# Patient Record
Sex: Female | Born: 1975 | Race: Black or African American | Hispanic: No | Marital: Married | Smoking: Never smoker
Health system: Southern US, Community
[De-identification: ages and names within clinical notes are randomized; demographics above are authoritative.]

## PROBLEM LIST (undated history)

## (undated) DIAGNOSIS — I10 Essential (primary) hypertension: Secondary | ICD-10-CM

---

## 2016-02-16 ENCOUNTER — Encounter (HOSPITAL_COMMUNITY): Payer: Self-pay | Admitting: Unknown Physician Specialty

## 2016-02-23 ENCOUNTER — Ambulatory Visit (HOSPITAL_COMMUNITY)
Admission: RE | Admit: 2016-02-23 | Discharge: 2016-02-23 | Disposition: A | Discharge status: Home / Self Care | Payer: Medicaid - Out of State | Source: Ambulatory Visit | Attending: Unknown Physician Specialty | Admitting: Unknown Physician Specialty

## 2016-02-23 ENCOUNTER — Encounter (HOSPITAL_COMMUNITY): Payer: Self-pay

## 2016-02-23 VITALS — BP 146/99 | HR 121

## 2016-02-23 DIAGNOSIS — O10012 Pre-existing essential hypertension complicating pregnancy, second trimester: Secondary | ICD-10-CM | POA: Diagnosis not present

## 2016-02-23 DIAGNOSIS — O09522 Supervision of elderly multigravida, second trimester: Secondary | ICD-10-CM | POA: Insufficient documentation

## 2016-02-23 DIAGNOSIS — Z315 Encounter for genetic counseling: Secondary | ICD-10-CM | POA: Diagnosis present

## 2016-02-23 DIAGNOSIS — O09529 Supervision of elderly multigravida, unspecified trimester: Secondary | ICD-10-CM | POA: Insufficient documentation

## 2016-02-23 DIAGNOSIS — O43212 Placenta accreta, second trimester: Secondary | ICD-10-CM | POA: Diagnosis not present

## 2016-02-23 DIAGNOSIS — Z3A21 21 weeks gestation of pregnancy: Secondary | ICD-10-CM | POA: Insufficient documentation

## 2016-02-23 HISTORY — DX: Essential (primary) hypertension: I10

## 2016-02-23 NOTE — Progress Notes (Signed)
Genetic Counseling  Visit Summary Note  Appointment Date: 02/23/2016 Referred By: Santo Held  Date of Birth: 10-12-1975  Pregnancy history: [id number] Estimated Date of Delivery: 06/29/16 Estimated Gestational Age: 25w6dAttending: DRenella Cunas MD  Ms. JJoesphine Schemmwas seen for genetic counseling because of a maternal age of 89     In summary:  Discussed increased risk for fetal aneuploidy with advanced maternal age  Reviewed results of patient's NIPS and ultrasound  Discussed limitations of screening and option of diagnostic testing  Declined amniocentesis  Reviewed family history concerns  Discussed carrier screening options-declined  She was counseled regarding maternal age and the association with risk for chromosome conditions due to nondisjunction with aging of the ova.  We reviewed chromosomes, nondisjunction, and the associated 1 in 563risk for fetal aneuploidy related to a maternal age of 89at 224w6destation.  She was counseled that the risk for aneuploidy decreases as gestational age increases, accounting for those pregnancies which spontaneously abort.  We specifically discussed Down syndrome (trisomy 2169 trisomies 1345nd 1857and sex chromosome aneuploidies (47,XXX and 47,XXY) including the common features and prognoses of each.   We also reviewed the results of Tracey Haynes-invasive prenatal screening (NIPS) and ultrasound. We discussed that NIPS analyzes placental cell free DNA in maternal circulation to evaluate for the presence of extra chromosome conditions.  Thus, it is able to provide risk assessment for specific chromosome conditions, but is not diagnostic.  Specifically, Tracey Haynes Panorama with 22q11 deletion screening. Based on this test, the chance for her baby to have aneuploidy for chromosomes 13, 18 or 21 was reduced to less than 1 in 10000.  The sex chromosome analysis was also within normal limits and was consistent with female fetal sex. The  chance for fetal 22q11 deletion syndrome was reduced from 1 in 2,000 to ~1 in 3,000.   Ms. JoFebresreviously had a detailed anatomy ultrasound. She was counseled that 50-80% of fetuses with Down syndrome and up to 90% of fetuses with trisomies 1311nd 18, when well visualized, have detectable anomalies or soft markers by ultrasound.  We discussed that her ultrasound was limited due to fetal positioning, but no specific concerns were identified at this time.  The patient is scheduled to return for a follow-up ultrasound in 2 weeks.  We also discussed the availability of diagnostic testing via amniocentesis.  We reviewed the risks, benefits and limitations of amniocentesis including the approximate 1 in 500 risk for pregnancy complications following amniocentesis. We discussed the possible results that the tests might provide including: positive, negative, unanticipated, and no result. Finally, she counseled regarding the cost of each option and potential out of pocket expenses. After reviewing the above results and the available options, Tracey Haynes that she is not interested in pursuing diagnostic testing at this time or in the future, given the associated risk of complications.  She understands that ultrasound and NIPS cannot rule out all birth defects or genetic syndromes.    Tracey Haynes provided with written information regarding cystic fibrosis (CF), spinal muscular atrophy (SMA) and hemoglobinopathies including the carrier frequency, availability of carrier screening and prenatal diagnosis if indicated.  In addition, we discussed that CF and hemoglobinopathies are routinely screened for as part of the [place] newborn screening panel.  After further discussion, she declined screening for CF, SMA and hemoglobinopathies.  Both family histories were reviewed and found to be contributory for Ms. JoHazelettaving a daughter, with a different partner, who  has autism. This child was born at [redacted] weeks  gestation due to severe preeclampsia. We discussed that autism is part of the spectrum of conditions referred to as Autistic spectrum disorders (ASD).  We discussed that ASDs are among the most common neurodevelopmental disorders, with approximately 1 in 110 children meeting criteria for ASD.  Approximately 80% of individuals diagnosed are female.  There is strong evidence that genetic factors play a critical role in development of ASD.  There have been recent advances in identifying specific genetic causes of ASD, however, there are still many individuals for whom the etiology of the ASD is not known.  Once a family has a child with a diagnosis of ASD, there is a 13.5% chance to have another child with ASD.  If the pregnancy is female the chance is approximately 26%.  Given that the current pregnancy has a different father, the risk of recurrence is estimated to be 13%. She understands that at this time there is not genetic testing available for ASD for most families. The remainder of the family histories were noncontributory for birth defects, intellectual disability, and known genetic conditions. Without further information regarding the provided family history, an accurate genetic risk cannot be calculated. Further genetic counseling is warranted if more information is obtained.  Tracey Haynes denied exposure to environmental toxins or chemical agents. She denied the use of alcohol, tobacco or street drugs. She denied significant viral illnesses during the course of her pregnancy. Her medical and surgical histories were contributory for [facility]. Tracey Haynes met with Dr. Margurite Auerbach for MFM consult today. Please see his note for specific details regarding recommendations and management.   I counseled Tracey Haynes regarding the above risks and available options.  The approximate face-to-face time with the genetic counselor was 37 minutes.  Filbert Schilder, MS  Certified Genetic Counselor

## 2016-02-23 NOTE — Progress Notes (Signed)
MFM consult, Staff Note: ? I spoke to your patient regarding her pregnancy complicated by placenta previa in context of 3 prior cesarean sections. [facility] which are consistent with PLACENTA ACCRETA. Bleeding complicates approximately 3% of pregnancies in the third trimester. Bleeding can be a cause of major perinatal and maternal morbidity and mortality and is most frequently due to placental separation. Other causes include placenta previa and placental accreta. ? Placenta accreta is associated with abnormal adherence of the placenta to the uterine wall. The incidence has increased and seems to parallel the increasing cesarean section rate. The incidence is about 1 in 500 pregnancies. The strongest association of placenta accreta is placenta previa and uterine surgery. In patients with placenta previa, the risk of accreta is 10-25% with one prior cesarean section. The risk of accreta is 3%, 11%, 40%, 61% and 67% with the first, second, third, fourth and fifth cesarean section respectively.  Given three prior cesarean sections, anterior previa, and images showing lack of thin retroplacental sonolucent border that is notably irregular, she has >50% chance of a morbidly adherent placenta. ? On review, there was no clear evidence of increta, percreta or invasion of the placenta into the bladder or surrounding organs/structures. She clearly understands this is a serious condition that WILL LIKELY REQUIRE CESAREAN HYSTERECTOMY (unless the placenta just spontaneously delivers during repeat cesarean). I recommend complete transfer of care, discontinuation of the low dose aspirin (risk of inhibiting platelet function in this patient greatly outweighs any minimal risk of IUGR/IUFD reduction by [facility]), and [facility]. ? Chronic hypertension (CHTN): During our discussion, I reviewed hypertension as a cause of uteroplacental insufficiency, with  increased risk of IUGR, oligohydramnios, and stillbirth. I told her that her hypertension also places her at increased risk for preeclampsia, describing the triad of increased blood pressure, proteinuria, and abnormal edema. Lastly, hypertension (severe range) increases the risk of placental abruption, especially in the setting of superimposed preeclampsia.  I reviewed the essential tenets in the most recent guidelines for management of hypertension in pregnancy in accordance with the American College of Obstetrics and Gynecology expert opinion. We talked about the medical treatment of hypertension in pregnancy. I outlined the different classes of medications, emphasizing that angiotensin enzyme inhibitors and angoitensin receptor blockers are contraindicated, and diuretics are relatively contraindicated. I told her that beta-blockers and calcium channel blockers are commonly used to treat hypertension in pregnancy, and that both are felt to be safe for use in pregnancy.  ? Her dose of labetalol and procardia are adequate in current maintainance of her blood pressure in pregnancy as evidenced by BP today.  I outlined the usual plan of management for hypertension in pregnancy. She should have her blood pressure carefully followed, and her medications adjusted to keep her BP in the target range of around 130-159/70-109 mm/Hg; ie, HTN should not be treated (no medication adjustment) until 160/110 or greater measurements for blood pressures to be consistent with current ACOG/SMFM guidelines (ie, guidelines are 160/110 in absence of renal/cardiac disease during pregnancy).   All of her questions were answered.  She prefers to keep regular prenatal visits with you locally until the 3rd trimester with monthly ultrasounds here in the interim.  I scheduled her accordingly.  If you agree with transfer of care, please go ahead and place the request now.  Summary of Recommendations:  A.  Accreta: 1. Transfer of  care 2. Discontinuation of low dose aspirin and bleeding  precautions (ie, patient reports minimal spotting on occasion but no menstrual like bleeding) 3. Pelvic rest 4. Multidisciplinary team approach  A. Anesthesia consultation (late trimester) B. Gynecologic oncology consultation C. NICU consultation (late trimester) 5. Continued counseling regarding cesarean hysterectomy (dialogue initiated today--she understands this will be her mode of delivery) 6. Fetal growth ultrasounds q4 weeks with cervical length assessment 7. If bleeding occurs in second and third trimester admit to the hospital for minimum 72 hr assessment 8. Pelvic MRI 28-30 weeks  9. consider admission around 32 weeks or so depending on bleeding, cervical length, etc. 10. In-hospital assessment of stable patient: NST once to twice daily. Weekly AFI, presentation scan. 11. Delivery should be individualized, however, maternal and neonatal outcome is optimized in stable patients with planned delivery at 34-35 weeks, after betamethasone.  B. HTN 1.discontinuation of HCTZ (risk for oligohydramnios) 2. Adjustment of labetalol and/or nifedipine to maintain BP <160/110  3. Antenatal testing at 32 weeks 4. Interval growth monthly by ultrasound as above  C. Prior preterm delivery 1. Weekly Makena 2. I recommend that transfer of care for prenatal visits be delayed until 28-30 weeks given the burden of transportation for the patient.  I feel that her prenatal visits, BP checks, and Makena injections can be maintained locally for her convenience (ie, she lives in [place], [place]), provided you are comfortable with this recommendation.  Time Spent: I spent in excess of 60 minutes in consultation with this patient to review records, evaluate her case, and provide her with an adequate discussion and education. More than 50% of this time was spent in direct face-to-face counseling. ? It was a pleasure seeing your patient in the office today.  Thank you for consultation. Please do not hesitate to contact our service for any further questions.  ? Thank you, ? Louann Sjogren Mariapaula Krist  ? Rogelia Boga, MD, MS, FACOG Assistant Professor Section of Maternal-Fetal Medicine [facility]

## 2016-02-26 ENCOUNTER — Encounter (HOSPITAL_COMMUNITY): Payer: Self-pay

## 2016-02-26 ENCOUNTER — Other Ambulatory Visit (HOSPITAL_COMMUNITY): Payer: Self-pay

## 2016-03-08 ENCOUNTER — Encounter (HOSPITAL_COMMUNITY): Payer: Self-pay

## 2016-03-09 ENCOUNTER — Ambulatory Visit (HOSPITAL_COMMUNITY)
Admission: RE | Admit: 2016-03-09 | Discharge: 2016-03-09 | Disposition: A | Discharge status: Home / Self Care | Payer: Medicaid - Out of State | Source: Ambulatory Visit | Attending: Unknown Physician Specialty | Admitting: Unknown Physician Specialty

## 2016-03-09 ENCOUNTER — Other Ambulatory Visit (HOSPITAL_COMMUNITY): Payer: Self-pay | Admitting: Obstetrics and Gynecology

## 2016-03-09 ENCOUNTER — Encounter (HOSPITAL_COMMUNITY): Payer: Self-pay

## 2016-03-09 DIAGNOSIS — Z3A24 24 weeks gestation of pregnancy: Secondary | ICD-10-CM | POA: Insufficient documentation

## 2016-03-09 DIAGNOSIS — O09212 Supervision of pregnancy with history of pre-term labor, second trimester: Secondary | ICD-10-CM

## 2016-03-09 DIAGNOSIS — O09892 Supervision of other high risk pregnancies, second trimester: Secondary | ICD-10-CM

## 2016-03-09 DIAGNOSIS — O09529 Supervision of elderly multigravida, unspecified trimester: Secondary | ICD-10-CM

## 2016-03-09 DIAGNOSIS — O34219 Maternal care for unspecified type scar from previous cesarean delivery: Secondary | ICD-10-CM

## 2016-03-09 DIAGNOSIS — O10019 Pre-existing essential hypertension complicating pregnancy, unspecified trimester: Secondary | ICD-10-CM

## 2016-03-09 DIAGNOSIS — Z3689 Encounter for other specified antenatal screening: Secondary | ICD-10-CM

## 2016-03-09 DIAGNOSIS — O09522 Supervision of elderly multigravida, second trimester: Secondary | ICD-10-CM | POA: Insufficient documentation

## 2016-03-09 DIAGNOSIS — O10112 Pre-existing hypertensive heart disease complicating pregnancy, second trimester: Secondary | ICD-10-CM | POA: Insufficient documentation

## 2016-03-09 DIAGNOSIS — Z36 Encounter for antenatal screening of mother: Secondary | ICD-10-CM | POA: Diagnosis not present

## 2016-03-09 MED ORDER — BETAMETHASONE SOD PHOS & ACET 6 (3-3) MG/ML IJ SUSP
12.0000 mg | Freq: Once | INTRAMUSCULAR | Status: AC
  Administered 2016-03-09: 12 mg via INTRAMUSCULAR
  Filled 2016-03-09: qty 2

## 2016-03-10 ENCOUNTER — Other Ambulatory Visit (HOSPITAL_COMMUNITY): Payer: Self-pay

## 2016-03-10 ENCOUNTER — Ambulatory Visit (HOSPITAL_COMMUNITY)
Admission: RE | Admit: 2016-03-10 | Discharge: 2016-03-10 | Disposition: A | Discharge status: Home / Self Care | Payer: Medicaid - Out of State | Source: Ambulatory Visit | Attending: Unknown Physician Specialty | Admitting: Unknown Physician Specialty

## 2016-03-10 ENCOUNTER — Encounter (HOSPITAL_COMMUNITY): Payer: Self-pay

## 2016-03-10 DIAGNOSIS — O99212 Obesity complicating pregnancy, second trimester: Secondary | ICD-10-CM | POA: Diagnosis not present

## 2016-03-10 DIAGNOSIS — O09522 Supervision of elderly multigravida, second trimester: Secondary | ICD-10-CM | POA: Diagnosis not present

## 2016-03-10 DIAGNOSIS — O34219 Maternal care for unspecified type scar from previous cesarean delivery: Secondary | ICD-10-CM | POA: Insufficient documentation

## 2016-03-10 DIAGNOSIS — O09212 Supervision of pregnancy with history of pre-term labor, second trimester: Secondary | ICD-10-CM | POA: Diagnosis not present

## 2016-03-10 DIAGNOSIS — O10012 Pre-existing essential hypertension complicating pregnancy, second trimester: Secondary | ICD-10-CM | POA: Diagnosis not present

## 2016-03-10 DIAGNOSIS — O43212 Placenta accreta, second trimester: Secondary | ICD-10-CM | POA: Insufficient documentation

## 2016-03-10 DIAGNOSIS — O365990 Maternal care for other known or suspected poor fetal growth, unspecified trimester, not applicable or unspecified: Secondary | ICD-10-CM

## 2016-03-10 DIAGNOSIS — Z3A24 24 weeks gestation of pregnancy: Secondary | ICD-10-CM | POA: Diagnosis not present

## 2016-03-10 DIAGNOSIS — O4402 Placenta previa specified as without hemorrhage, second trimester: Secondary | ICD-10-CM | POA: Insufficient documentation

## 2016-03-10 MED ORDER — BETAMETHASONE SOD PHOS & ACET 6 (3-3) MG/ML IJ SUSP
12.0000 mg | Freq: Once | INTRAMUSCULAR | Status: AC
  Administered 2016-03-10: 12 mg via INTRAMUSCULAR
  Filled 2016-03-10: qty 2

## 2016-03-11 ENCOUNTER — Other Ambulatory Visit (HOSPITAL_COMMUNITY): Payer: Self-pay

## 2016-03-11 DIAGNOSIS — IMO0002 Reserved for concepts with insufficient information to code with codable children: Secondary | ICD-10-CM

## 2016-03-14 ENCOUNTER — Ambulatory Visit (HOSPITAL_COMMUNITY)
Admission: RE | Admit: 2016-03-14 | Discharge: 2016-03-14 | Disposition: A | Discharge status: Home / Self Care | Payer: Medicaid - Out of State | Source: Ambulatory Visit | Attending: Unknown Physician Specialty | Admitting: Unknown Physician Specialty

## 2016-03-14 ENCOUNTER — Encounter (HOSPITAL_COMMUNITY): Payer: Self-pay

## 2016-03-14 DIAGNOSIS — O10012 Pre-existing essential hypertension complicating pregnancy, second trimester: Secondary | ICD-10-CM | POA: Diagnosis not present

## 2016-03-14 DIAGNOSIS — O09522 Supervision of elderly multigravida, second trimester: Secondary | ICD-10-CM | POA: Insufficient documentation

## 2016-03-14 DIAGNOSIS — O4402 Placenta previa specified as without hemorrhage, second trimester: Secondary | ICD-10-CM | POA: Insufficient documentation

## 2016-03-14 DIAGNOSIS — O43212 Placenta accreta, second trimester: Secondary | ICD-10-CM | POA: Insufficient documentation

## 2016-03-14 DIAGNOSIS — O99212 Obesity complicating pregnancy, second trimester: Secondary | ICD-10-CM | POA: Diagnosis not present

## 2016-03-14 DIAGNOSIS — O09212 Supervision of pregnancy with history of pre-term labor, second trimester: Secondary | ICD-10-CM | POA: Insufficient documentation

## 2016-03-14 DIAGNOSIS — Z3A24 24 weeks gestation of pregnancy: Secondary | ICD-10-CM | POA: Insufficient documentation

## 2016-03-14 DIAGNOSIS — IMO0002 Reserved for concepts with insufficient information to code with codable children: Secondary | ICD-10-CM

## 2016-03-14 DIAGNOSIS — O34219 Maternal care for unspecified type scar from previous cesarean delivery: Secondary | ICD-10-CM | POA: Insufficient documentation

## 2016-03-15 ENCOUNTER — Other Ambulatory Visit (HOSPITAL_COMMUNITY): Payer: Medicaid - Out of State

## 2016-03-16 ENCOUNTER — Encounter (HOSPITAL_COMMUNITY): Payer: Self-pay

## 2016-03-17 ENCOUNTER — Encounter (HOSPITAL_COMMUNITY): Payer: Self-pay

## 2016-03-17 ENCOUNTER — Ambulatory Visit (HOSPITAL_COMMUNITY)
Admission: RE | Admit: 2016-03-17 | Discharge: 2016-03-17 | Disposition: A | Discharge status: Home / Self Care | Payer: Medicaid - Out of State | Source: Ambulatory Visit | Attending: Unknown Physician Specialty | Admitting: Unknown Physician Specialty

## 2016-03-17 DIAGNOSIS — O365920 Maternal care for other known or suspected poor fetal growth, second trimester, not applicable or unspecified: Secondary | ICD-10-CM | POA: Diagnosis not present

## 2016-03-17 DIAGNOSIS — O09212 Supervision of pregnancy with history of pre-term labor, second trimester: Secondary | ICD-10-CM | POA: Insufficient documentation

## 2016-03-17 DIAGNOSIS — O34219 Maternal care for unspecified type scar from previous cesarean delivery: Secondary | ICD-10-CM | POA: Diagnosis not present

## 2016-03-17 DIAGNOSIS — O09522 Supervision of elderly multigravida, second trimester: Secondary | ICD-10-CM | POA: Diagnosis present

## 2016-03-17 DIAGNOSIS — O10012 Pre-existing essential hypertension complicating pregnancy, second trimester: Secondary | ICD-10-CM | POA: Insufficient documentation

## 2016-03-17 DIAGNOSIS — Z3A25 25 weeks gestation of pregnancy: Secondary | ICD-10-CM | POA: Diagnosis not present

## 2016-03-17 DIAGNOSIS — O43212 Placenta accreta, second trimester: Secondary | ICD-10-CM | POA: Diagnosis not present

## 2016-03-17 DIAGNOSIS — O4402 Placenta previa specified as without hemorrhage, second trimester: Secondary | ICD-10-CM | POA: Diagnosis not present

## 2016-03-17 DIAGNOSIS — O99212 Obesity complicating pregnancy, second trimester: Secondary | ICD-10-CM | POA: Diagnosis not present

## 2016-03-17 DIAGNOSIS — IMO0002 Reserved for concepts with insufficient information to code with codable children: Secondary | ICD-10-CM

## 2016-03-18 ENCOUNTER — Ambulatory Visit (HOSPITAL_COMMUNITY): Payer: Medicaid - Out of State

## 2016-03-21 ENCOUNTER — Ambulatory Visit (HOSPITAL_COMMUNITY)
Admission: RE | Admit: 2016-03-21 | Discharge: 2016-03-21 | Disposition: A | Discharge status: Home / Self Care | Payer: Medicaid - Out of State | Source: Ambulatory Visit | Attending: Unknown Physician Specialty | Admitting: Unknown Physician Specialty

## 2016-03-21 ENCOUNTER — Encounter (HOSPITAL_COMMUNITY): Payer: Self-pay

## 2016-03-21 DIAGNOSIS — IMO0002 Reserved for concepts with insufficient information to code with codable children: Secondary | ICD-10-CM

## 2016-03-21 DIAGNOSIS — O10012 Pre-existing essential hypertension complicating pregnancy, second trimester: Secondary | ICD-10-CM | POA: Diagnosis not present

## 2016-03-21 DIAGNOSIS — O09212 Supervision of pregnancy with history of pre-term labor, second trimester: Secondary | ICD-10-CM | POA: Insufficient documentation

## 2016-03-21 DIAGNOSIS — O365920 Maternal care for other known or suspected poor fetal growth, second trimester, not applicable or unspecified: Secondary | ICD-10-CM | POA: Insufficient documentation

## 2016-03-21 DIAGNOSIS — O43212 Placenta accreta, second trimester: Secondary | ICD-10-CM | POA: Diagnosis not present

## 2016-03-21 DIAGNOSIS — O09522 Supervision of elderly multigravida, second trimester: Secondary | ICD-10-CM | POA: Insufficient documentation

## 2016-03-21 DIAGNOSIS — Z3A25 25 weeks gestation of pregnancy: Secondary | ICD-10-CM | POA: Insufficient documentation

## 2016-03-21 DIAGNOSIS — O34219 Maternal care for unspecified type scar from previous cesarean delivery: Secondary | ICD-10-CM | POA: Insufficient documentation

## 2016-03-21 DIAGNOSIS — O4402 Placenta previa specified as without hemorrhage, second trimester: Secondary | ICD-10-CM | POA: Insufficient documentation

## 2016-03-24 ENCOUNTER — Ambulatory Visit (HOSPITAL_COMMUNITY): Payer: Medicaid - Out of State

## 2016-03-28 ENCOUNTER — Ambulatory Visit (HOSPITAL_COMMUNITY): Payer: Medicaid - Out of State

## 2016-03-31 ENCOUNTER — Ambulatory Visit (HOSPITAL_COMMUNITY): Payer: Medicaid - Out of State

## 2016-12-28 ENCOUNTER — Encounter (HOSPITAL_COMMUNITY): Payer: Self-pay

## 2017-07-11 IMAGING — US US MFM UA CORD DOPPLER
1 series · 14 of 16 positions shown · non-contrast
Comparison: none

[Series 1: us mfm ua cord doppler · 16 acquisitions, 14 frames shown]
[im 1/16]
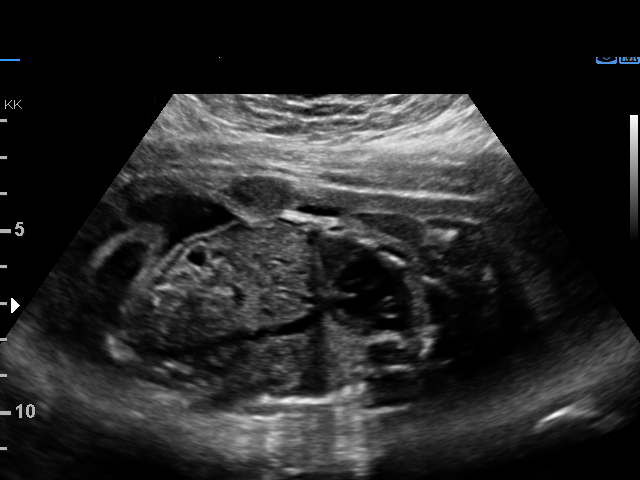
[im 2/16]
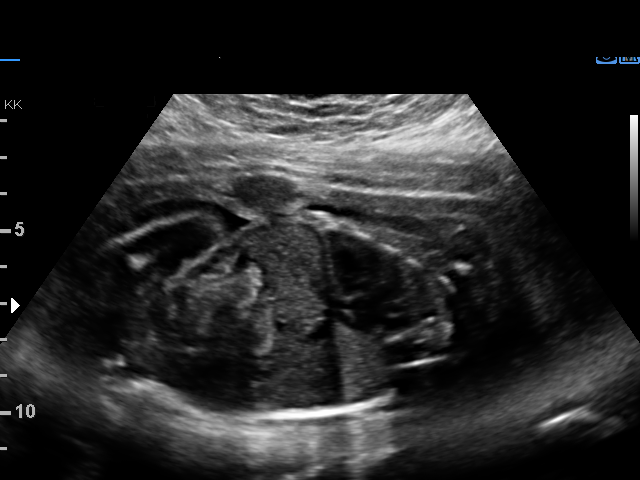
[im 3/16]
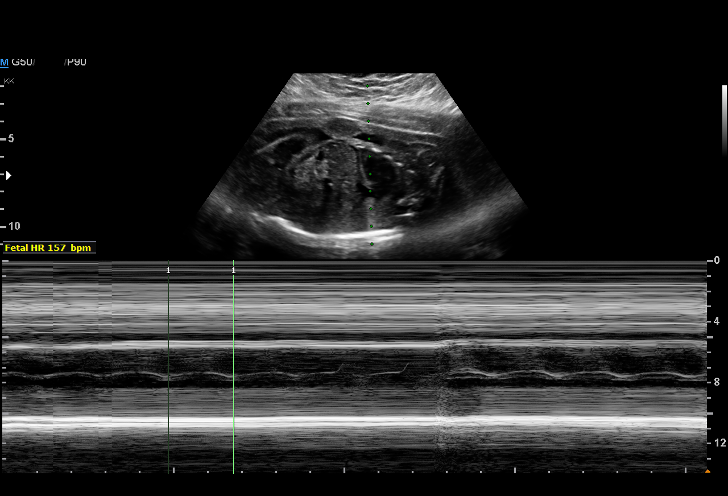
[im 5/16]
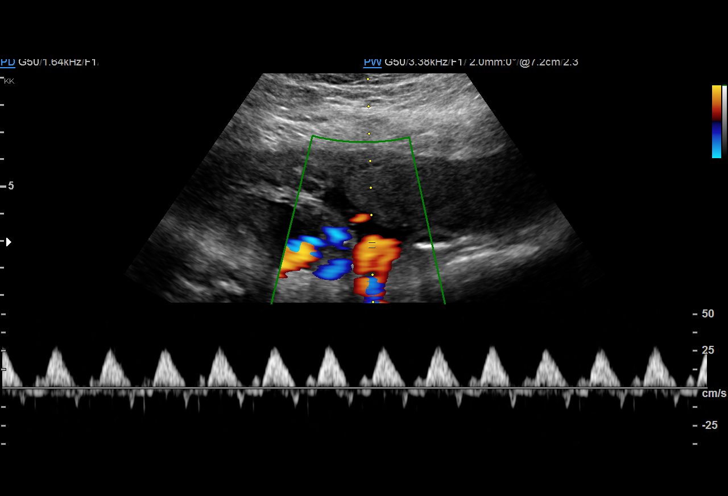
[im 6/16]
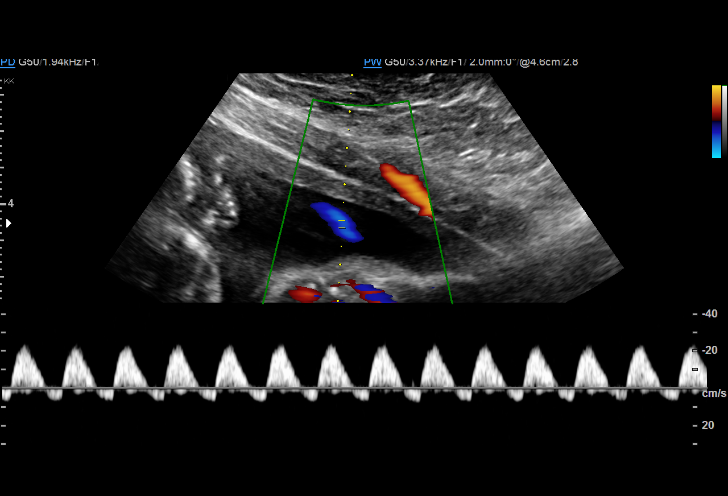
[im 7/16]
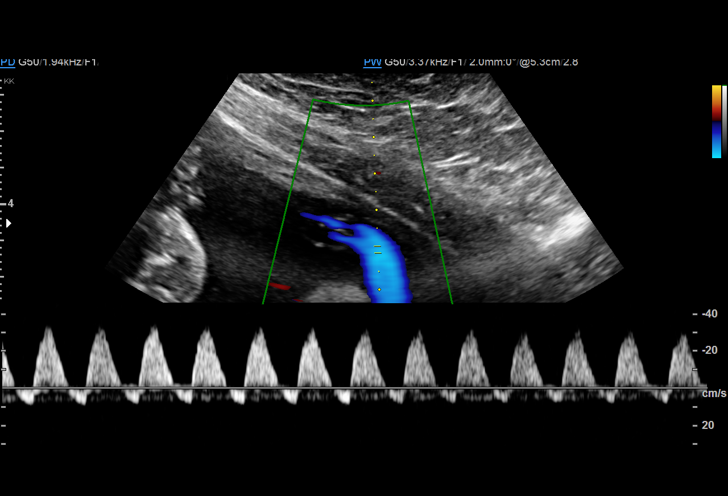
[im 8/16]
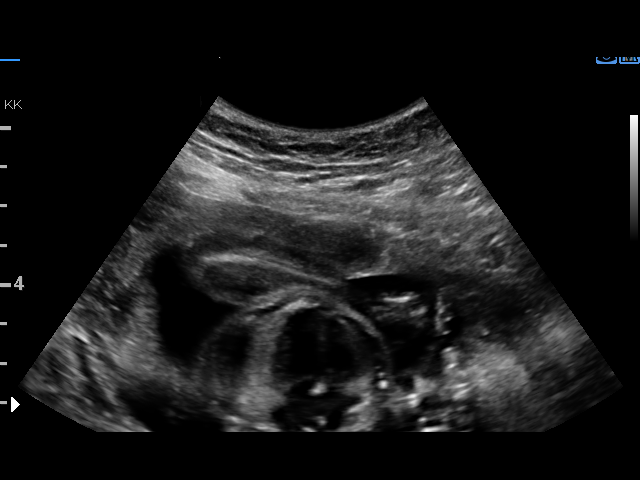
[im 9/16]
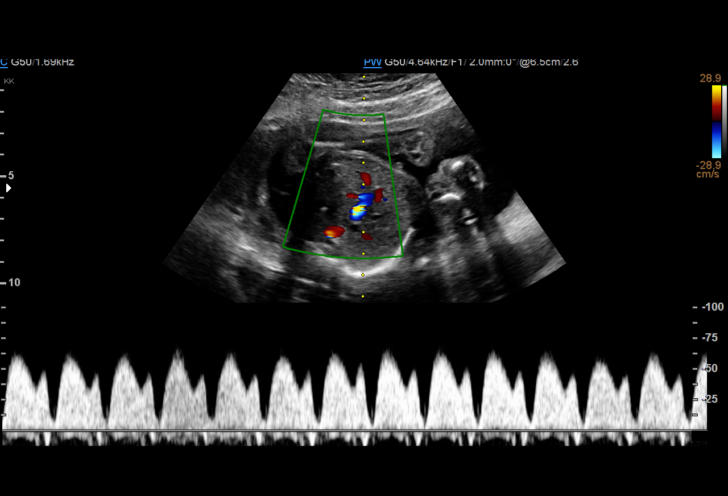
[im 10/16]
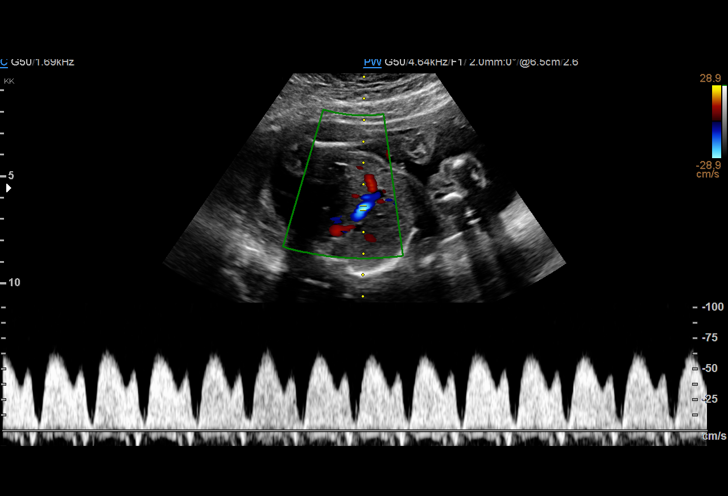
[im 11/16]
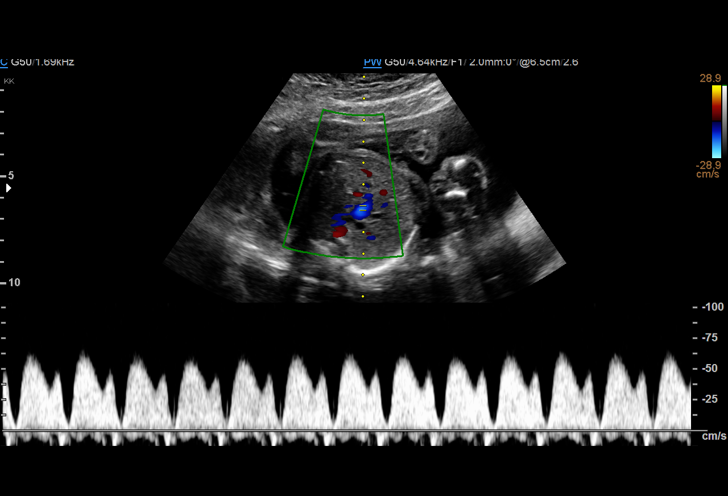
[im 13/16]
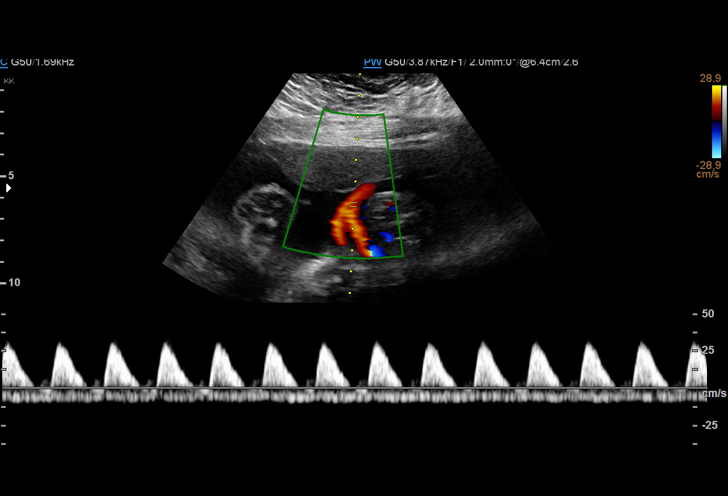
[im 14/16]
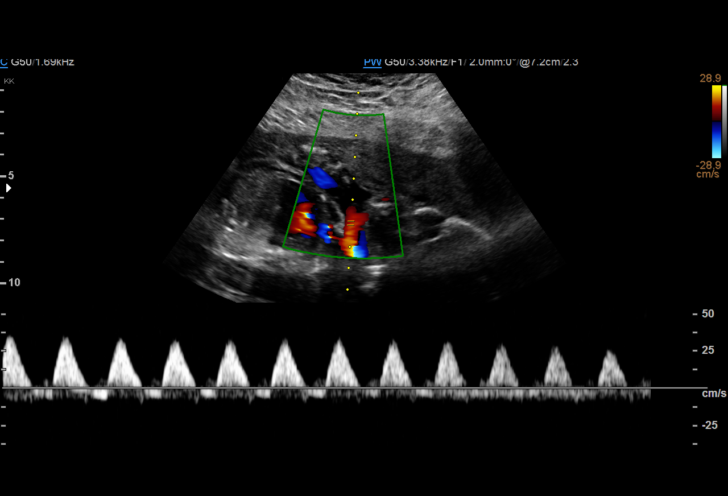
[im 15/16]
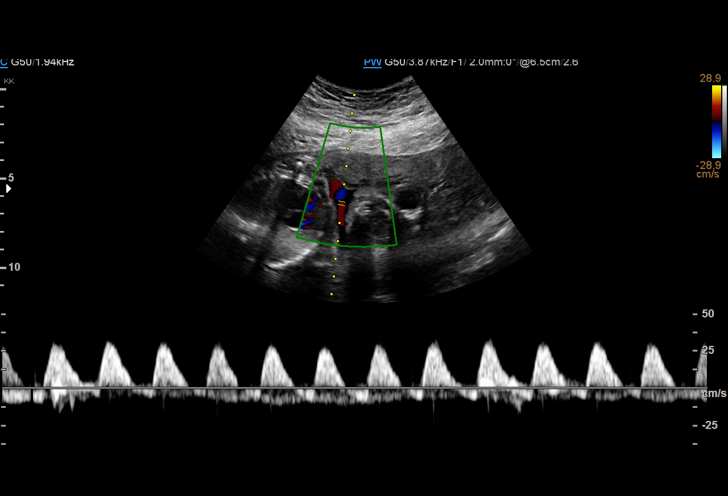
[im 16/16]
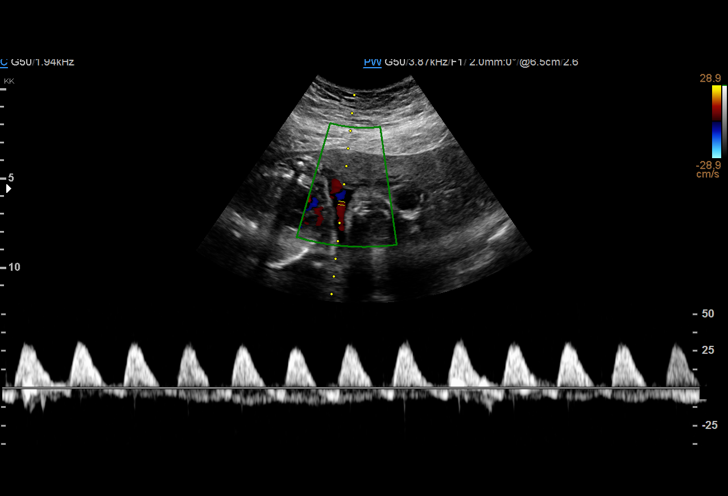

[14 of 16 positions shown; findings below may reference images not displayed]

[REDACTED]
OB/GYN
Ref. Address:     [facility]
[REDACTED] [id number]

[address]            [id number]      [id number]     [id number]
Indications

25 weeks gestation of pregnancy
25 weeks gestation of pregnancy
Advanced maternal age multigravida 35+,
second trimester
Hypertension - Chronic/Pre-existing on
labetalol and Procardia
History of cesarean delivery x 3, currently
pregnant
Poor obstetric history: Previous preterm
delivery x 2, antepartum (one neonatal
demise)
Placenta previa specified as without
hemorrhage, second trimester
Placenta accreta in second trimester
Obesity complicating pregnancy, second
trimester
Maternal care for known or suspected poor
fetal growth, second trimester, not applicable
or unspecified
OB History

Blood Type:            Height:  5'7"   Weight (lb):  246      BMI:
Gravidity:    4         Term:   1        Prem:   1        SAB:   1
TOP:          0       Ectopic:  0        Living: 2
Fetal Evaluation

Num Of Fetuses:     1
Fetal Heart         157
Rate(bpm):
Cardiac Activity:   Observed
Presentation:       Cephalic
Gestational Age

Best:          25w 5d    Det. By:   Early Ultrasound         EDD:   06/29/16
(11/21/15)
Doppler - Fetal Vessels

Umbilical Artery
ADFV    RDFV
Yes     Yes

Comments

Ms. Maria Zbigniew Och returns for follow up UA Doppler studies.  On
exam today, absent and reverse diastolic flow ins noted on
UA Doppler studies.  Ductus venosus waveform shows
decreased diastolic flow during the A-wave, but does not
appear to be absent or reversed.  The fetus is active.

The patient was counseled that our medical recommendation
would be for admission at [facility]] for
continuous fetal monitoring and delivery based on the fetal
tracing.  She has an appointment at the [facility] on [REDACTED],
but I explained to her that there is a risk of stillbirth and that
this could occur prior to her appointment on [REDACTED]. The
patient informed us that she would have to speak with her
husband first and left the office before AMA paperwork could
be prepared.
Impression

Single IUP at 25w 5d
Poor fetal growth, placenta previa, suspected morbidly
adherent placenta - completed course of [place]
UA Doppler studies: Absent and reversed diastolic flow
Ductus venosus waveform - decreased diastolic flow  noted
in the a-wave, but not absent or reversed
Normal amniotic fluid volume
Recommendations

Patient was strongly encouraged to go the [REDACTED] for admission - told us that she would contact us after
she speaks with her husband

Feel strongly that the patient needs inpatient management at
this time.
